# Patient Record
Sex: Female | Born: 1990 | Race: White | Hispanic: No | Marital: Married | Smoking: Current every day smoker
Health system: Southern US, Community
[De-identification: ages and names within clinical notes are randomized; demographics above are authoritative.]

---
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
## 2010-11-15 LAB — DIFFERENTIAL
Basophils Absolute: 0 10*3/uL (ref 0.0–0.1)
Basophils Relative: 0 % (ref 0–1)
Lymphocytes Relative: 38 % (ref 24–48)
Monocytes Relative: 9 % (ref 3–11)
Neutro Abs: 5.2 10*3/uL (ref 1.7–8.0)
Neutrophils Relative %: 50 % (ref 43–71)

## 2010-11-15 LAB — RAPID URINE DRUG SCREEN, HOSP PERFORMED
Barbiturates: NOT DETECTED
Cocaine: POSITIVE — AB
Opiates: NOT DETECTED

## 2010-11-15 LAB — CBC
Hemoglobin: 12.8 g/dL (ref 12.0–16.0)
MCHC: 34.6 g/dL (ref 31.0–37.0)
RBC: 4.2 MIL/uL (ref 3.80–5.70)
RDW: 13.3 % (ref 11.4–15.5)

## 2010-11-15 LAB — BASIC METABOLIC PANEL
CO2: 24 mEq/L (ref 19–32)
Calcium: 9.5 mg/dL (ref 8.4–10.5)
Creatinine, Ser: 0.88 mg/dL (ref 0.4–1.2)
Sodium: 141 mEq/L (ref 135–145)

## 2016-05-22 ENCOUNTER — Ambulatory Visit (INDEPENDENT_AMBULATORY_CARE_PROVIDER_SITE_OTHER): Payer: BLUE CROSS/BLUE SHIELD | Admitting: Medical

## 2016-05-22 ENCOUNTER — Encounter: Payer: Self-pay | Admitting: Medical

## 2016-05-22 VITALS — BP 110/70 | HR 76 | Resp 18 | Ht 66.25 in | Wt 245.8 lb

## 2016-05-22 DIAGNOSIS — L608 Other nail disorders: Secondary | ICD-10-CM | POA: Diagnosis not present

## 2016-05-22 DIAGNOSIS — Z79899 Other long term (current) drug therapy: Secondary | ICD-10-CM | POA: Diagnosis not present

## 2016-05-22 DIAGNOSIS — F640 Transsexualism: Secondary | ICD-10-CM | POA: Diagnosis not present

## 2016-05-22 DIAGNOSIS — B351 Tinea unguium: Secondary | ICD-10-CM

## 2016-05-22 DIAGNOSIS — Z2821 Immunization not carried out because of patient refusal: Secondary | ICD-10-CM | POA: Diagnosis not present

## 2016-05-22 DIAGNOSIS — Z789 Other specified health status: Secondary | ICD-10-CM

## 2016-05-22 MED ORDER — TERBINAFINE HCL 250 MG PO TABS: 250.0000 mg | ORAL_TABLET | Freq: Every day | ORAL | 1 refills | Status: DC

## 2016-05-22 NOTE — Progress Notes (Signed)
Subjective: Chief Complaint  Patient presents with  . Toe Pain    yellow tint to toe nails.    . New Patient (Initial Visit)   Here as a new patient.    Of note, "Cristina Ford" is a female to female transgender patient.  Sees [facility] for hormone therapy, Dr. Maudie Ford, on testosterone therapy.  [place]   Has had yellowish and thickening of toenails x a year or more.   No prior prescription medication but has tried OTC    Works in restoration of damaged housing, water, Air cabin crewfire, Catering manageretc.   No past medical history on file.  No current outpatient prescriptions on file prior to visit.   No current facility-administered medications on file prior to visit.     ROS as in subjective  Objective: BP 110/70   Pulse 76   Resp 18   Ht 5' 6.25" (1.683 m)   Wt 245 lb 12.8 oz (111.5 kg)   LMP  (LMP Unknown) Comment: transgender  BMI 39.37 kg/m   Gen: wd, wn, nad Female appearing, full beard, coarse hair of arms bilat great toenails with brownish yellow coloration on about half the nails with thickened nails, similar appearing nails throughout but most obvious of great toenails Feet with normal sensation, pulses and cap refill No other nail findings, skin findings    Assessment: Encounter Diagnoses  Name Primary?  Marland Kitchen. Onychomycosis Yes  . Nail deformity   . High risk medication use   . Influenza vaccination declined   . Transgender       Plan: Discussed exam findings, symptoms.   Toenail culture sent, several different toenails were sampled.   Await results  Begin Lamisil if insurance covers Lamisil.  Discussed risk/benefits of medication.    Cristina Ford will get me copy of labs in a few weeks per the labs he gets done at endocrinology  Cristina Ford was seen today for toe pain and new patient (initial visit).  Diagnoses and all orders for this visit:  Onychomycosis -     Cult, Fungus, Skin,Hair,Nail w/KOH  Nail deformity -     Cult, Fungus, Skin,Hair,Nail w/KOH  High risk medication  use  Influenza vaccination declined  Transgender  Other orders -     terbinafine (LAMISIL) 250 MG tablet; Take 1 tablet (250 mg total) by mouth daily.

## 2016-06-02 LAB — CULT, FUNGUS, SKIN,HAIR,NAIL W/KOH

## 2016-06-14 ENCOUNTER — Telehealth: Payer: Self-pay | Admitting: Medical

## 2016-06-14 NOTE — Telephone Encounter (Signed)
Never received Prior Auth.  I called [facility] & Lamisil went thru for $4.  I called pt & informed

## 2016-09-20 ENCOUNTER — Emergency Department (HOSPITAL_COMMUNITY)
Admission: EM | Admit: 2016-09-20 | Discharge: 2016-09-20 | Disposition: A | Discharge status: Home / Self Care | Payer: BLUE CROSS/BLUE SHIELD | Attending: Emergency Medicine | Admitting: Emergency Medicine

## 2016-09-20 ENCOUNTER — Emergency Department (HOSPITAL_COMMUNITY): Payer: BLUE CROSS/BLUE SHIELD

## 2016-09-20 ENCOUNTER — Encounter (HOSPITAL_COMMUNITY): Payer: Self-pay

## 2016-09-20 DIAGNOSIS — K2900 Acute gastritis without bleeding: Secondary | ICD-10-CM | POA: Diagnosis not present

## 2016-09-20 DIAGNOSIS — R1011 Right upper quadrant pain: Secondary | ICD-10-CM

## 2016-09-20 DIAGNOSIS — F17200 Nicotine dependence, unspecified, uncomplicated: Secondary | ICD-10-CM | POA: Insufficient documentation

## 2016-09-20 DIAGNOSIS — R1013 Epigastric pain: Secondary | ICD-10-CM | POA: Diagnosis present

## 2016-09-20 LAB — URINALYSIS, ROUTINE W REFLEX MICROSCOPIC
BILIRUBIN URINE: NEGATIVE
Glucose, UA: NEGATIVE mg/dL
HGB URINE DIPSTICK: NEGATIVE
KETONES UR: NEGATIVE mg/dL
Leukocytes, UA: NEGATIVE
NITRITE: NEGATIVE
Protein, ur: NEGATIVE mg/dL
Specific Gravity, Urine: 1.011 (ref 1.005–1.030)
pH: 6 (ref 5.0–8.0)

## 2016-09-20 LAB — COMPREHENSIVE METABOLIC PANEL
ALBUMIN: 4 g/dL (ref 3.5–5.0)
ALK PHOS: 45 U/L (ref 38–126)
ALT: 21 U/L (ref 14–54)
AST: 17 U/L (ref 15–41)
Anion gap: 8 (ref 5–15)
BILIRUBIN TOTAL: 0.4 mg/dL (ref 0.3–1.2)
BUN: 15 mg/dL (ref 6–20)
CO2: 23 mmol/L (ref 22–32)
Calcium: 9 mg/dL (ref 8.9–10.3)
Chloride: 106 mmol/L (ref 101–111)
Creatinine, Ser: 0.88 mg/dL (ref 0.44–1.00)
GFR calc Af Amer: >60 mL/min (ref >60)
GFR calc non Af Amer: >60 mL/min (ref >60)
GLUCOSE: 99 mg/dL (ref 65–99)
Potassium: 3.7 mmol/L (ref 3.5–5.1)
SODIUM: 137 mmol/L (ref 135–145)
TOTAL PROTEIN: 7.4 g/dL (ref 6.5–8.1)

## 2016-09-20 LAB — CBC
HCT: 39.4 % (ref 36.0–46.0)
Hemoglobin: 13.3 g/dL (ref 12.0–15.0)
MCH: 28.9 pg (ref 26.0–34.0)
MCHC: 33.8 g/dL (ref 30.0–36.0)
MCV: 85.7 fL (ref 78.0–100.0)
Platelets: 255 10*3/uL (ref 150–400)
RBC: 4.6 MIL/uL (ref 3.87–5.11)
RDW: 13.6 % (ref 11.5–15.5)
WBC: 10.6 10*3/uL — ABNORMAL HIGH (ref 4.0–10.5)

## 2016-09-20 LAB — LIPASE, BLOOD: Lipase: 24 U/L (ref 11–51)

## 2016-09-20 MED ORDER — GI COCKTAIL ~~LOC~~
30.0000 mL | Freq: Once | ORAL | Status: AC
  Administered 2016-09-20: 30 mL via ORAL
  Filled 2016-09-20: qty 30

## 2016-09-20 MED ORDER — OMEPRAZOLE 20 MG PO CPDR: 20.0000 mg | DELAYED_RELEASE_CAPSULE | Freq: Every day | ORAL | 0 refills | Status: AC

## 2016-09-20 NOTE — Discharge Instructions (Signed)
Please read attached information. If you experience any new or worsening signs or symptoms please return to the emergency room for evaluation. Please follow-up with your primary care provider or specialist as discussed. Please use medication prescribed only as directed and discontinue taking if you have any concerning signs or symptoms.   °

## 2016-09-20 NOTE — ED Notes (Signed)
Pt aware we need a urine sample but is not able to go at this time. He will try "in a little bit."

## 2016-09-20 NOTE — ED Triage Notes (Signed)
Pt reports epigastric pain and nausea since Monday night. Denies vomiting or diarrhea. A&Ox4.

## 2016-09-20 NOTE — ED Provider Notes (Signed)
[facility]-EMERGENCY DEPT Provider Note   CSN: [id number] Arrival date & time: 09/20/16  1444  By signing my name below, I, Majel HomerPeyton Lee, attest that this documentation has been prepared under the direction and in the presence of Newell RubbermaidJeffrey Soriah Leeman, PA-C . Electronically Signed: Majel HomerPeyton Lee, Scribe. 09/20/2016. 3:12 PM.  History   Chief Complaint Chief Complaint  Patient presents with  . Abdominal Pain    Epigastric  . Nausea   The history is provided by the patient. No language interpreter was used.   HPI Comments: Cristina Ford is a 26 y.o. female that identifies as a female with PMHx of high risk medication use, who presents to the Emergency Department complaining of gradually worsening, intermittent, epigastric abdominal pain that began ~2 days ago. Pt reports the pain is exacerbated after eating with associated mild nausea. Pt states hx of GERD but notes this has resolved since losing weight. Pt notes his current pain does not feel similar to prior exacerbations of this. Pt reports he recently received testosterone in which he had labs performed and was told "his liver function was fine." Pt denies any fever, vomiting, or diarrhea.   History reviewed. No pertinent past medical history.  Patient Active Problem List   Diagnosis Date Noted  . Onychomycosis 05/22/2016  . Nail deformity 05/22/2016  . High risk medication use 05/22/2016  . Influenza vaccination declined 05/22/2016   History reviewed. No pertinent surgical history.  OB History    No data available     Home Medications    Prior to Admission medications   Medication Sig Start Date End Date Taking? Authorizing Provider  Testosterone Cypionate 250 MG/ML SOLN Inject 0.5 mLs into the muscle once a week.   Yes Historical Provider, MD  omeprazole (PRILOSEC) 20 MG capsule Take 1 capsule (20 mg total) by mouth daily. 09/20/16   Eyvonne MechanicJeffrey Nikitta Sobiech, PA-C  terbinafine (LAMISIL) 250 MG tablet Take 1 tablet (250 mg total) by mouth  daily. Patient not taking: Reported on 09/20/2016 05/22/16   Jac Canavanavid S Tysinger, PA-C    Family History History reviewed. No pertinent family history.  Social History Social History  Substance Use Topics  . Smoking status: Current Every Day Smoker    Packs/day: 0.33  . Smokeless tobacco: Never Used  . Alcohol use Not on file   Allergies   Sulfa antibiotics  Review of Systems Review of Systems  Constitutional: Negative for fever.  Gastrointestinal: Positive for abdominal pain and nausea. Negative for diarrhea and vomiting.   Physical Exam Updated Vital Signs BP 115/75 (BP Location: Right Arm)   Pulse 84   Temp 98.2 F (36.8 C) (Oral)   Resp 16   Ht 5\' 6"  (1.676 m)   Wt 240 lb (108.9 kg)   SpO2 98%   BMI 38.74 kg/m   Physical Exam  Constitutional: She is oriented to person, place, and time. She appears well-developed and well-nourished.  HENT:  Head: Normocephalic.  Eyes: EOM are normal.  Neck: Normal range of motion.  Pulmonary/Chest: Effort normal.  Abdominal: She exhibits no distension. There is tenderness.  TTP of the epigastric and RUQ region.   Musculoskeletal: Normal range of motion.  Neurological: She is alert and oriented to person, place, and time.  Psychiatric: She has a normal mood and affect.  Nursing note and vitals reviewed.  ED Treatments / Results  DIAGNOSTIC STUDIES:  Oxygen Saturation is 98% on RA, normal by my interpretation.    COORDINATION OF CARE:  3:13 PM Discussed treatment plan  with pt at bedside and pt agreed to plan.  Labs (all labs ordered are listed, but only abnormal results are displayed) Labs Reviewed  CBC - Abnormal; Notable for the following:       Result Value   WBC 10.6 (*)    All other components within normal limits  LIPASE, BLOOD  COMPREHENSIVE METABOLIC PANEL  URINALYSIS, ROUTINE W REFLEX MICROSCOPIC   EKG  EKG Interpretation None       Radiology US Abdomen Limited Ruq  Result Date: 09/20/2016 CLINICAL  DATA:  Acute onset of right upper quadrant abdominal pain. Initial encounter. EXAM: US ABDOMEN LIMITED - RIGHT UPPER QUADRANT COMPARISON:  None. FINDINGS: Gallbladder: No gallstones or wall thickening visualized. Partially contracted and grossly unremarkable in appearance. No sonographic Murphy sign noted by sonographer. Common bile duct: Diameter: 0.4 cm, within normal limits in caliber. Liver: No focal lesion identified. Increased parenchymal echogenicity likely reflects fatty infiltration. IMPRESSION: 1. No acute abnormality seen at the right upper quadrant. 2. Diffuse fatty infiltration within the liver. Electronically Signed   By: Roanna Raider M.D.   On: 09/20/2016 16:18    Procedures Procedures (including critical care time)  Medications Ordered in ED Medications  gi cocktail (Maalox,Lidocaine,Donnatal) (30 mLs Oral Given 09/20/16 1523)    Initial Impression / Assessment and Plan / ED Course  I have reviewed the triage vital signs and the nursing notes.  Pertinent labs & imaging results that were available during my care of the patient were reviewed by me and considered in my medical decision making (see chart for details).  Labs: Urinalysis, Lipase, CBC, CMP  Imaging: US Abdomen Limited RUQ  Consults:   Therapeutics: GI cocktail  Discharge Meds: Omeprazole  Assessment/Plan:   Patient's presentation is consistent with gastritis.  He has a significant past medical history of the same.  No significant findings on ultrasound, reassuring laboratory analysis.  Improvement with GI cocktail.  Patient will be discharged home with proton pump inhibitor, close follow-up with primary care and/or gastroenterologist for repeat evaluation.  Strict return precautions given.  He verbalizes understanding and agreement to this plan and had no further questions or concerns at home discharge  I personally performed the services described in this documentation, which was scribed in my presence. The  recorded information has been reviewed and is accurate.     Final Clinical Impressions(s) / ED Diagnoses   Final diagnoses:  RUQ pain  Acute gastritis, presence of bleeding unspecified, unspecified gastritis type    New Prescriptions New Prescriptions   OMEPRAZOLE (PRILOSEC) 20 MG CAPSULE    Take 1 capsule (20 mg total) by mouth daily.     Eyvonne Mechanic, PA-C 09/20/16 1655    Lyndal Pulley, MD 09/21/16 [id number]

## 2017-08-09 IMAGING — US US ABDOMEN LIMITED
1 series · 14 of 25 positions shown · non-contrast
Comparison: None.

CLINICAL DATA: Acute onset of right upper quadrant abdominal pain.
Initial encounter.

EXAM:
US ABDOMEN LIMITED - RIGHT UPPER QUADRANT

[Series 1: us abdomen limited · 0.22mm/px · 14 of 59 slices shown]
[im 1/59]
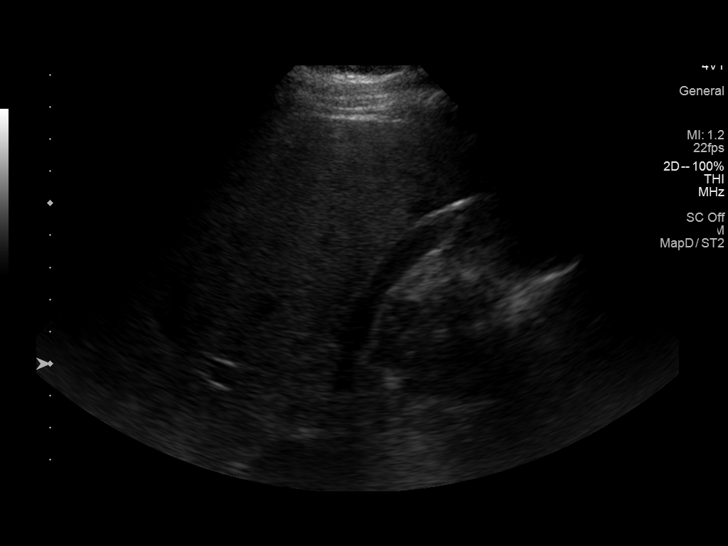
[im 5/59]
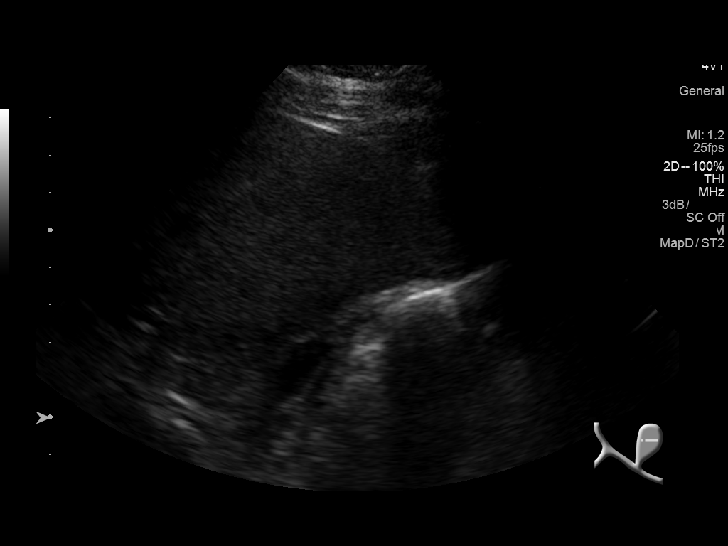
[im 10/59]
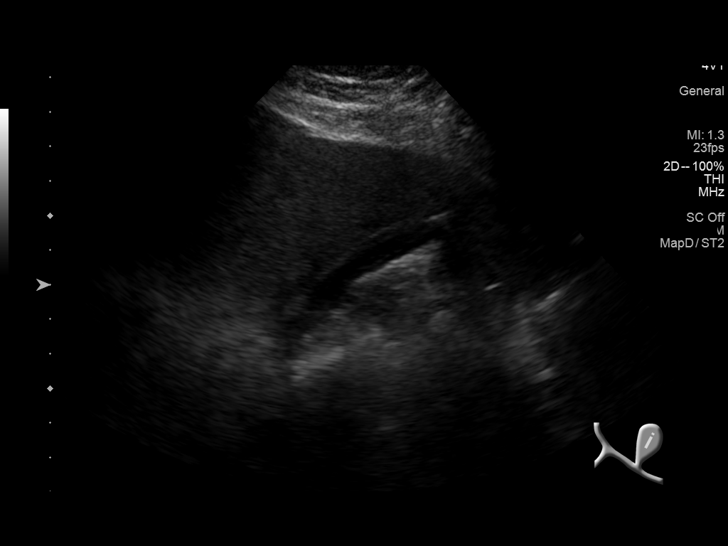
[im 15/59]
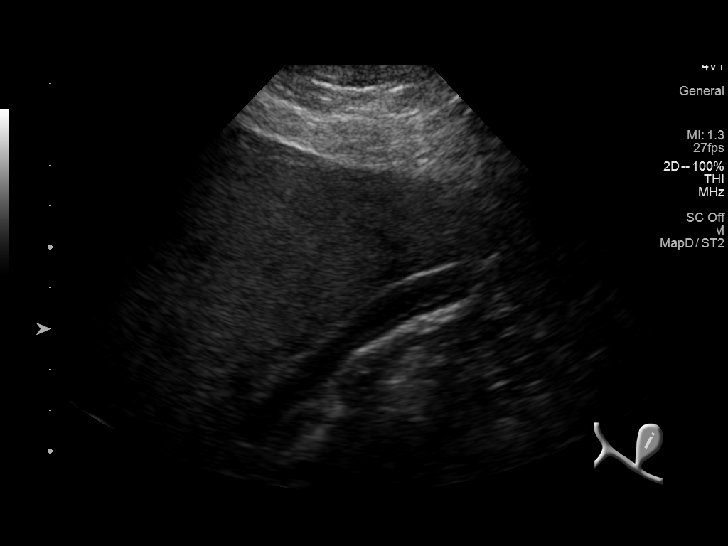
[im 20/59]
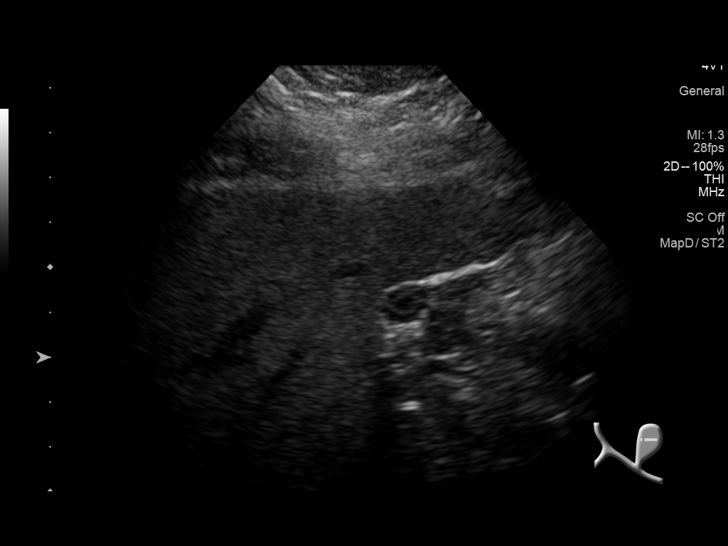
[im 22/59]
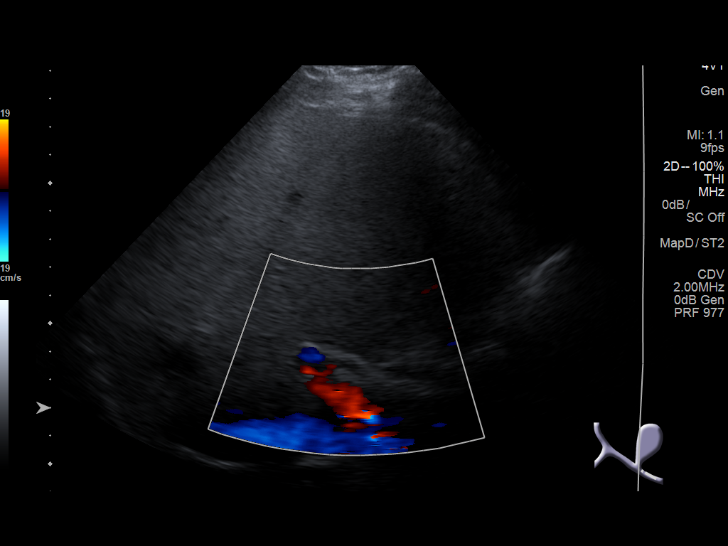
[im 27/59]
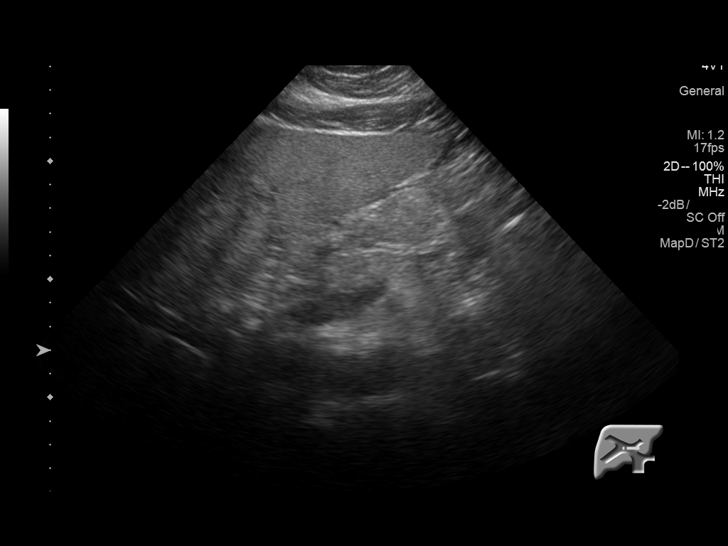
[im 32/59]
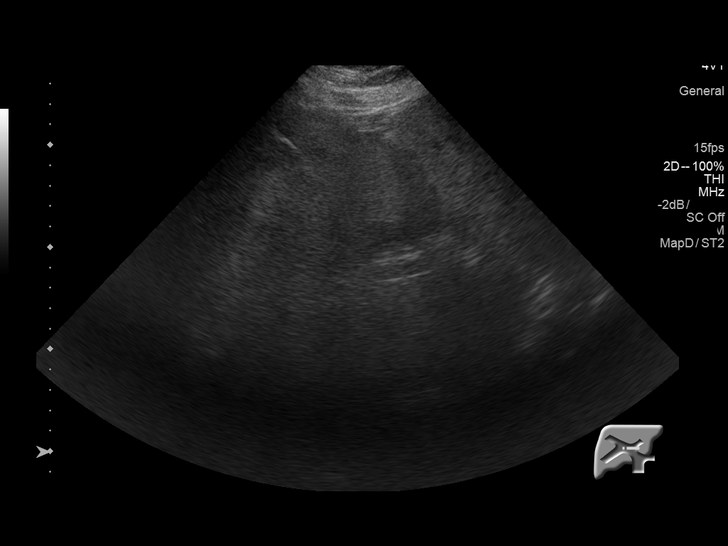
[im 37/59]
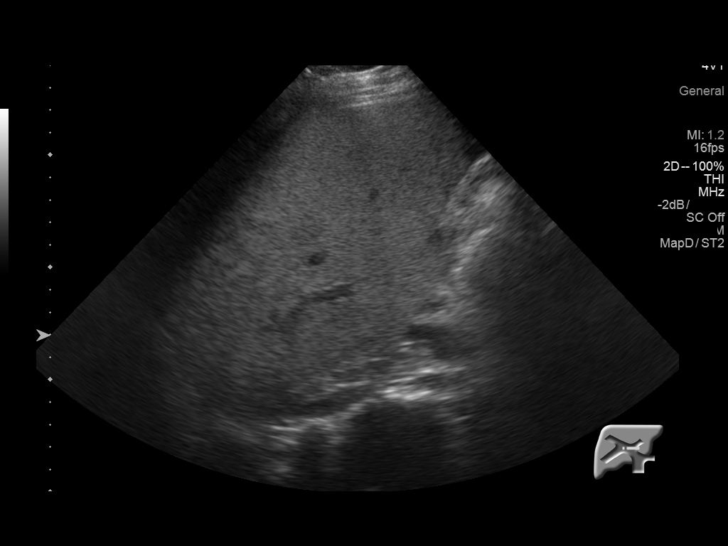
[im 39/59]
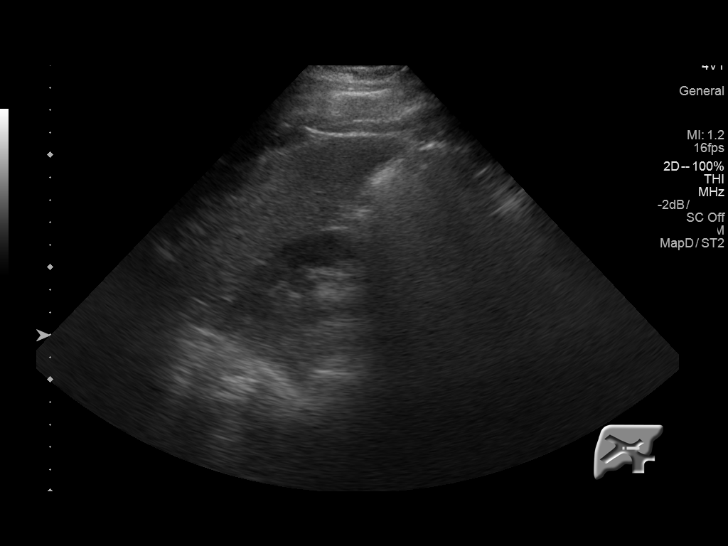
[im 44/59]
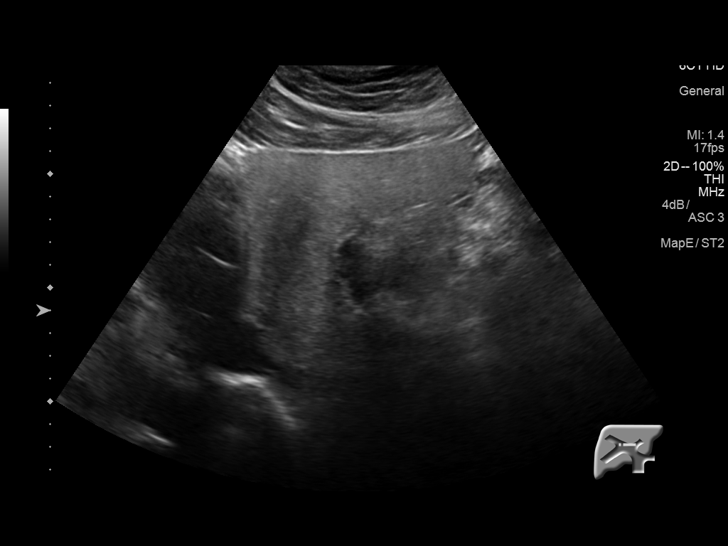
[im 49/59]
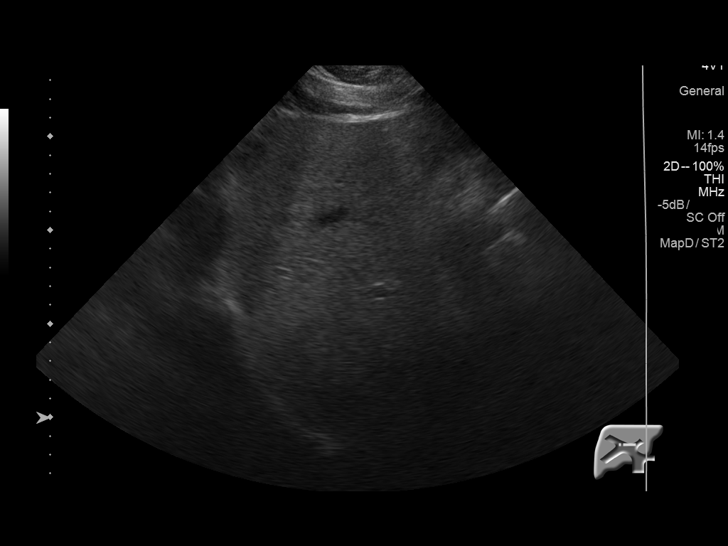
[im 54/59]
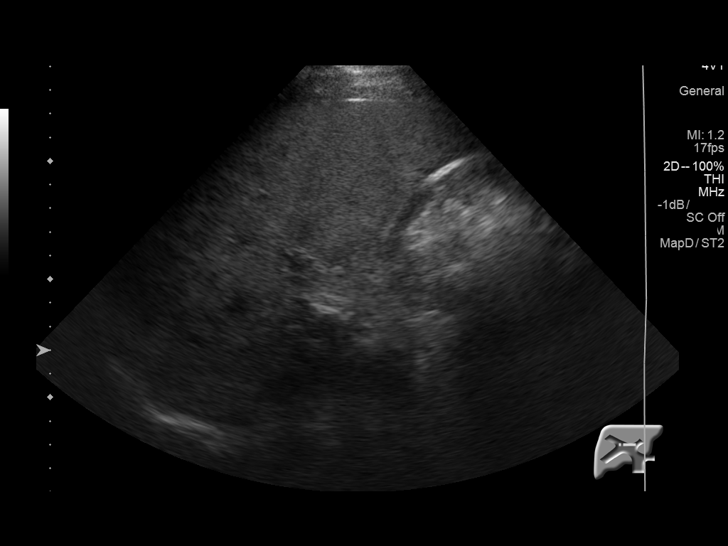
[im 59/59]
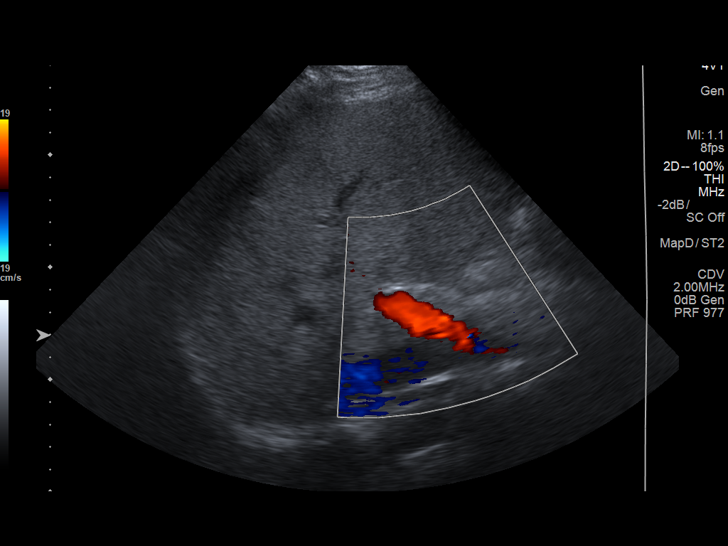

[14 of 25 positions shown; findings below may reference images not displayed]

FINDINGS: Gallbladder:

No gallstones or wall thickening visualized. Partially contracted
and grossly unremarkable in appearance. No sonographic Murphy sign
noted by sonographer.

Common bile duct:

Diameter: 0.4 cm, within normal limits in caliber.

Liver:

No focal lesion identified. Increased parenchymal echogenicity
likely reflects fatty infiltration.
IMPRESSION: 1. No acute abnormality seen at the right upper quadrant.
2. Diffuse fatty infiltration within the liver.

## 2018-09-04 ENCOUNTER — Ambulatory Visit: Payer: BLUE CROSS/BLUE SHIELD | Admitting: Family Medicine

## 2018-09-24 ENCOUNTER — Encounter: Payer: Self-pay | Admitting: Family Medicine

## 2018-09-24 ENCOUNTER — Other Ambulatory Visit: Payer: Self-pay

## 2018-09-24 ENCOUNTER — Ambulatory Visit (INDEPENDENT_AMBULATORY_CARE_PROVIDER_SITE_OTHER): Payer: Self-pay | Admitting: Family Medicine

## 2018-09-24 VITALS — BP 116/74 | HR 68 | Temp 97.8°F | Ht 66.0 in | Wt 261.6 lb

## 2018-09-24 DIAGNOSIS — Z789 Other specified health status: Secondary | ICD-10-CM

## 2018-09-24 DIAGNOSIS — F640 Transsexualism: Secondary | ICD-10-CM

## 2018-09-24 MED ORDER — TESTOSTERONE CYPIONATE 200 MG/ML IM SOLN: INTRAMUSCULAR | 0 refills | Status: DC

## 2018-09-24 NOTE — Patient Instructions (Addendum)
Let's try 0.5 ml under the skin once a week. In about 3 or 4 weeks, come get a lab test. You can call the day before and ask about the best time for a lab only visit. The orders should already be in the sytem. I will then either call you or send you a note in the mail.  I will determine your follow up based on your labs  If you want to come in and talk about smoking cessation, please do that at your convenience.  I will plan on seeing you 2-3 times a year in general to monitor your  medicttion.  Great to meet you!  This surgeon has done some work for my patients and seems to be well liked with excellent results  Dr. Glenna Fellows, MD [facility]  Plastic surgeon in [place], [facility] Address: [address], [place], [place] [address] Phone: [phone]

## 2018-09-27 DIAGNOSIS — Z789 Other specified health status: Secondary | ICD-10-CM | POA: Insufficient documentation

## 2018-09-27 DIAGNOSIS — F640 Transsexualism: Secondary | ICD-10-CM | POA: Insufficient documentation

## 2018-09-27 NOTE — Assessment & Plan Note (Signed)
Restart testosterone therapy.  Informed consent regarding masculinizing therapy discussed, signed copy in the chart.  After he has been on therapy for 3 to 4 weeks, I will have him return to get some lab work.  We instructed him today and subcutaneous injection.  Interestingly, he had been doing intramuscular injection with testosterone every week.

## 2018-09-27 NOTE — Progress Notes (Signed)
    CHIEF COMPLAINT / HPI: Transgender female to female On hormone therapy since 2017 but has been off it for 3 weeks because he ran out. Wants to restart.  Establish care.  Potentially injured in future surgical interventions. Smokes 1/3 pack/day  REVIEW OF SYSTEMS: Gradual weight gain over the last several years.  PERTINENT  PMH / PSH: I have reviewed the patient's medications, allergies, past medical and surgical history, smoking status and updated in the EMR as appropriate. In a stable monogamous relationship.  OBJECTIVE:  Vital signs reviewed. GENERAL: Well-developed, well-nourished, no acute distress. CARDIOVASCULAR: Regular rate and rhythm no murmur gallop or rub LUNGS: Clear to auscultation bilaterally, no rales or wheeze. ABDOMEN: Soft positive bowel sounds NEURO: No gross focal neurological deficits. MSK: Movement of extremity x 4.    ASSESSMENT / PLAN:  Female-to-female transgender person Restart testosterone therapy.  Informed consent regarding masculinizing therapy discussed, signed copy in the chart.  After he has been on therapy for 3 to 4 weeks, I will have him return to get some lab work.  We instructed him today and subcutaneous injection.  Interestingly, he had been doing intramuscular injection with testosterone every week.

## 2018-11-05 ENCOUNTER — Other Ambulatory Visit: Payer: Self-pay

## 2018-11-05 DIAGNOSIS — Z789 Other specified health status: Secondary | ICD-10-CM

## 2018-11-05 DIAGNOSIS — F640 Transsexualism: Secondary | ICD-10-CM

## 2018-11-06 LAB — CBC
Hematocrit: 41.6 % (ref 34.0–46.6)
Hemoglobin: 13.9 g/dL (ref 11.1–15.9)
MCH: 29.1 pg (ref 26.6–33.0)
MCHC: 33.4 g/dL (ref 31.5–35.7)
MCV: 87 fL (ref 79–97)
Platelets: 289 10*3/uL (ref 150–450)
RBC: 4.77 x10E6/uL (ref 3.77–5.28)
RDW: 14.1 % (ref 11.7–15.4)
WBC: 10.6 10*3/uL (ref 3.4–10.8)

## 2018-11-06 LAB — BASIC METABOLIC PANEL
BUN/Creatinine Ratio: 18 (ref 9–23)
BUN: 17 mg/dL (ref 6–20)
CO2: 20 mmol/L (ref 20–29)
Calcium: 9.2 mg/dL (ref 8.7–10.2)
Chloride: 105 mmol/L (ref 96–106)
Creatinine, Ser: 0.92 mg/dL (ref 0.57–1.00)
GFR calc Af Amer: 99 mL/min/{1.73_m2} (ref >59)
GFR calc non Af Amer: 86 mL/min/{1.73_m2} (ref >59)
Glucose: 86 mg/dL (ref 65–99)
Potassium: 4.2 mmol/L (ref 3.5–5.2)
Sodium: 142 mmol/L (ref 134–144)

## 2018-11-06 LAB — TESTOSTERONE: Testosterone: 529 ng/dL — ABNORMAL HIGH (ref 8–48)

## 2018-11-06 LAB — ESTRADIOL: Estradiol: 27.7 pg/mL

## 2019-02-27 ENCOUNTER — Other Ambulatory Visit: Payer: Self-pay | Admitting: Family Medicine

## 2019-02-27 DIAGNOSIS — F640 Transsexualism: Secondary | ICD-10-CM

## 2019-02-27 DIAGNOSIS — Z789 Other specified health status: Secondary | ICD-10-CM

## 2019-09-25 ENCOUNTER — Other Ambulatory Visit: Payer: Self-pay

## 2019-09-25 DIAGNOSIS — F640 Transsexualism: Secondary | ICD-10-CM

## 2019-09-25 DIAGNOSIS — Z789 Other specified health status: Secondary | ICD-10-CM

## 2019-09-29 MED ORDER — TESTOSTERONE CYPIONATE 200 MG/ML IM SOLN: INTRAMUSCULAR | 5 refills | Status: DC

## 2020-06-20 ENCOUNTER — Other Ambulatory Visit: Payer: Self-pay | Admitting: Family Medicine

## 2020-06-20 DIAGNOSIS — Z789 Other specified health status: Secondary | ICD-10-CM

## 2021-04-23 ENCOUNTER — Other Ambulatory Visit: Payer: Self-pay | Admitting: Family Medicine

## 2021-04-23 DIAGNOSIS — Z789 Other specified health status: Secondary | ICD-10-CM
